# Patient Record
Sex: Female | Born: 1985 | Race: Black or African American | Hispanic: No | Marital: Single | Smoking: Never smoker
Health system: Southern US, Community
[De-identification: ages and names within clinical notes are randomized; demographics above are authoritative.]

## PROBLEM LIST (undated history)

## (undated) DIAGNOSIS — Z789 Other specified health status: Secondary | ICD-10-CM

## (undated) HISTORY — PX: NO PAST SURGERIES: SHX2092

---
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
## 2004-07-25 ENCOUNTER — Emergency Department (HOSPITAL_COMMUNITY): Admission: EM | Admit: 2004-07-25 | Discharge: 2004-07-25 | Payer: Self-pay | Admitting: Emergency Medicine

## 2005-05-16 ENCOUNTER — Encounter: Admission: RE | Admit: 2005-05-16 | Discharge: 2005-05-16 | Payer: Self-pay | Admitting: *Deleted

## 2007-12-20 ENCOUNTER — Emergency Department (HOSPITAL_COMMUNITY): Admission: EM | Admit: 2007-12-20 | Discharge: 2007-12-20 | Payer: Self-pay | Admitting: Emergency Medicine

## 2007-12-27 ENCOUNTER — Emergency Department (HOSPITAL_COMMUNITY): Admission: EM | Admit: 2007-12-27 | Discharge: 2007-12-27 | Payer: Self-pay | Admitting: Emergency Medicine

## 2009-07-21 ENCOUNTER — Emergency Department (HOSPITAL_COMMUNITY): Admission: EM | Admit: 2009-07-21 | Discharge: 2009-07-22 | Payer: Self-pay | Admitting: Emergency Medicine

## 2010-10-05 LAB — LIPASE, BLOOD: Lipase: 24 U/L (ref 11–59)

## 2010-10-05 LAB — DIFFERENTIAL
Basophils Absolute: 0.1 10*3/uL (ref 0.0–0.1)
Basophils Relative: 0 % (ref 0–1)
Monocytes Absolute: 1.1 10*3/uL — ABNORMAL HIGH (ref 0.1–1.0)
Neutro Abs: 9.4 10*3/uL — ABNORMAL HIGH (ref 1.7–7.7)
Neutrophils Relative %: 69 % (ref 43–77)

## 2010-10-05 LAB — CBC
MCHC: 35.1 g/dL (ref 30.0–36.0)
Platelets: 276 10*3/uL (ref 150–400)
RDW: 13.3 % (ref 11.5–15.5)

## 2010-10-05 LAB — URINALYSIS, ROUTINE W REFLEX MICROSCOPIC
Bilirubin Urine: NEGATIVE
Ketones, ur: NEGATIVE mg/dL
Nitrite: NEGATIVE
Urobilinogen, UA: 1 mg/dL (ref 0.0–1.0)

## 2010-10-05 LAB — HEPATIC FUNCTION PANEL
Albumin: 3.6 g/dL (ref 3.5–5.2)
Total Bilirubin: 0.7 mg/dL (ref 0.3–1.2)
Total Protein: 8.3 g/dL (ref 6.0–8.3)

## 2011-04-16 LAB — PREGNANCY, URINE: Preg Test, Ur: NEGATIVE

## 2011-04-16 LAB — POCT I-STAT, CHEM 8
BUN: 12
Calcium, Ion: 1.3
Chloride: 107
Sodium: 141

## 2011-04-16 LAB — POCT CARDIAC MARKERS
CKMB, poc: <1 — ABNORMAL LOW
Myoglobin, poc: 37.9
Operator id: 290111
Troponin i, poc: <0.05

## 2011-04-16 LAB — URINALYSIS, ROUTINE W REFLEX MICROSCOPIC
Bilirubin Urine: NEGATIVE
Hgb urine dipstick: NEGATIVE
Ketones, ur: 15 — AB
Protein, ur: NEGATIVE
Urobilinogen, UA: 1

## 2011-04-16 LAB — DIFFERENTIAL
Basophils Absolute: 0.1
Lymphocytes Relative: 25
Monocytes Absolute: 0.5
Neutro Abs: 6.9

## 2011-04-16 LAB — CBC
Hemoglobin: 12
RDW: 15.3

## 2011-04-16 LAB — POCT PREGNANCY, URINE: Preg Test, Ur: NEGATIVE

## 2013-04-12 ENCOUNTER — Encounter: Payer: Self-pay | Admitting: Gynecology

## 2014-08-06 ENCOUNTER — Ambulatory Visit (HOSPITAL_BASED_OUTPATIENT_CLINIC_OR_DEPARTMENT_OTHER): Payer: BC Managed Care – PPO

## 2014-08-06 ENCOUNTER — Other Ambulatory Visit (HOSPITAL_BASED_OUTPATIENT_CLINIC_OR_DEPARTMENT_OTHER): Payer: Self-pay | Admitting: Emergency Medicine

## 2014-08-06 ENCOUNTER — Ambulatory Visit (HOSPITAL_BASED_OUTPATIENT_CLINIC_OR_DEPARTMENT_OTHER)
Admission: RE | Admit: 2014-08-06 | Discharge: 2014-08-06 | Disposition: A | Discharge status: Home / Self Care | Payer: BC Managed Care – PPO | Source: Ambulatory Visit | Attending: Emergency Medicine | Admitting: Emergency Medicine

## 2014-08-06 DIAGNOSIS — R1310 Dysphagia, unspecified: Secondary | ICD-10-CM

## 2014-08-06 DIAGNOSIS — R221 Localized swelling, mass and lump, neck: Secondary | ICD-10-CM | POA: Diagnosis not present

## 2014-09-07 ENCOUNTER — Encounter (HOSPITAL_COMMUNITY): Payer: Self-pay

## 2014-09-07 ENCOUNTER — Emergency Department (INDEPENDENT_AMBULATORY_CARE_PROVIDER_SITE_OTHER): Payer: Self-pay

## 2014-09-07 ENCOUNTER — Emergency Department (INDEPENDENT_AMBULATORY_CARE_PROVIDER_SITE_OTHER)
Admission: EM | Admit: 2014-09-07 | Discharge: 2014-09-07 | Disposition: A | Discharge status: Home / Self Care | Payer: Self-pay | Source: Home / Self Care | Attending: Family Medicine | Admitting: Family Medicine

## 2014-09-07 DIAGNOSIS — E0789 Other specified disorders of thyroid: Secondary | ICD-10-CM

## 2014-09-07 DIAGNOSIS — R05 Cough: Secondary | ICD-10-CM

## 2014-09-07 DIAGNOSIS — D497 Neoplasm of unspecified behavior of endocrine glands and other parts of nervous system: Secondary | ICD-10-CM

## 2014-09-07 DIAGNOSIS — R0981 Nasal congestion: Secondary | ICD-10-CM

## 2014-09-07 DIAGNOSIS — R059 Cough, unspecified: Secondary | ICD-10-CM

## 2014-09-07 MED ORDER — IPRATROPIUM BROMIDE 0.06 % NA SOLN: 2.0000 | Freq: Four times a day (QID) | NASAL | Status: DC

## 2014-09-07 MED ORDER — BENZONATATE 100 MG PO CAPS: 100.0000 mg | ORAL_CAPSULE | Freq: Three times a day (TID) | ORAL | Status: DC | PRN

## 2014-09-07 NOTE — ED Notes (Signed)
Concerned about URI / influenza residual since early January. Has started to have URI type syx after recovering from inital illness. Was told by another provider she had a nodule in her thyroid gland that needed to be biopsied (has not been done) and she is now concerned about autoimmune thyroid illness after reading about her symptoms

## 2014-09-07 NOTE — ED Provider Notes (Signed)
CSN: [id number]     Arrival date & time 09/07/14  1244 History   First MD Initiated Contact with Patient 09/07/14 1331     Chief Complaint  Patient presents with  . Cough  . Thyroid Problem   (Consider location/radiation/quality/duration/timing/severity/associated sxs/prior Treatment) HPI Comments: In addition to URI symptoms, patient reports that she was diagnosed with a thyroid nodule in Jan. 2016 and requests that we perform biopsy of thyroid nodule in clinic today. States she developed thyroid enlargement in Dec 2015 and was seen at local urgent care and sent for thyroid U/S. States she no longer has health insurance and has not attempted to see her PCP for follow up or made arrangements for recommended thyroid biopsy. States that area of left sided thyroid enlargement has both reduced in size and discomfort and that she no longer has pain with swallowing. States she attempted to apply for Medicaid today but was informed that she did not qualify.   Patient is a 29 y.o. female presenting with URI.  URI Presenting symptoms: congestion, cough, rhinorrhea and sore throat   Presenting symptoms: no fatigue and no fever   Severity:  Mild Onset quality:  Gradual Duration:  1 week Timing:  Constant Progression:  Unchanged Chronicity:  Recurrent Ineffective treatments: NetiPot. Associated symptoms: neck pain, sneezing and swollen glands   Associated symptoms: no headaches, no sinus pain and no wheezing     History reviewed. No pertinent past medical history. History reviewed. No pertinent past surgical history. History reviewed. No pertinent family history. History  Substance Use Topics  . Smoking status: Never Smoker   . Smokeless tobacco: Not on file  . Alcohol Use: Yes   OB History    No data available     Review of Systems  Constitutional: Negative for fever, chills, activity change, appetite change, fatigue and unexpected weight change.  HENT: Positive for congestion,  postnasal drip, rhinorrhea, sneezing and sore throat.   Eyes: Negative.   Respiratory: Positive for cough. Negative for chest tightness, shortness of breath and wheezing.   Cardiovascular: Negative.   Gastrointestinal: Negative.   Endocrine: Negative for polydipsia, polyphagia and polyuria.  Musculoskeletal: Positive for neck pain.  Skin: Negative.   Neurological: Negative for dizziness, weakness, light-headedness and headaches.    Allergies  Review of patient's allergies indicates no known allergies.  Home Medications   Prior to Admission medications   Medication Sig Start Date End Date Taking? Authorizing Provider  benzonatate (TESSALON) 100 MG capsule Take 1 capsule (100 mg total) by mouth 3 (three) times daily as needed for cough. 09/07/14   Audelia Hives Fiorella Hanahan, PA  ipratropium (ATROVENT) 0.06 % nasal spray Place 2 sprays into both nostrils 4 (four) times daily. For nasal congestion 09/07/14   Annett Gula H Letticia Bhattacharyya, PA   BP 114/67 mmHg  Pulse 98  Temp(Src) 97 F (36.1 C) (Oral)  Resp 18  SpO2 97%  LMP 08/20/2014 (Exact Date) Physical Exam  Constitutional: She is oriented to person, place, and time. She appears well-developed and well-nourished. No distress.  HENT:  Head: Normocephalic and atraumatic.  Right Ear: Hearing, tympanic membrane, external ear and ear canal normal.  Left Ear: Hearing, tympanic membrane, external ear and ear canal normal.  Nose: Mucosal edema and rhinorrhea present.  Mouth/Throat: Uvula is midline, oropharynx is clear and moist and mucous membranes are normal.  Eyes: Conjunctivae are normal. No scleral icterus.  Neck: Normal range of motion, full passive range of motion without pain and phonation normal.  Neck supple. Thyromegaly present.    Outlined area is region of palpable neck mass at left lateral region of thyroid gland  Cardiovascular: Normal rate, regular rhythm and normal heart sounds.   Pulmonary/Chest: Effort normal and breath sounds  normal. No stridor. No respiratory distress. She has no wheezes.  Musculoskeletal: Normal range of motion.  Lymphadenopathy:    She has no cervical adenopathy.  Neurological: She is alert and oriented to person, place, and time.  Skin: Skin is warm and dry.  Psychiatric: She has a normal mood and affect.  Nursing note and vitals reviewed.   ED Course  Procedures (including critical care time) Labs Review Labs Reviewed - No data to display  Imaging Review Dg Chest 2 View  09/07/2014   CLINICAL DATA:  All waking at night due to coughing, productive cough, symptoms for 1 month getting worse over time, nonsmoker, history thyroid problems  EXAM: CHEST  2 VIEW  COMPARISON:  None  FINDINGS: Upper normal heart size.  Pulmonary vascularity normal.  Mass effect upon LEFT lateral aspect of trachea with LEFT-to-RIGHT displacement, question thyroid enlargement or mass.  Lungs clear.  No pleural effusion or pneumothorax.  Bones unremarkable.  IMPRESSION: Question enlargement versus mass within LEFT thyroid lobe exerting mass effect upon LEFT lateral aspect of trachea; followup thyroid sonography recommended to assess.   Electronically Signed   By: Lavonia Dana M.D.   On: 09/07/2014 14:37     MDM   1. Thyroid mass of unclear etiology   2. Cough   3. Nasal congestion    As we discussed, the mass at your left thyroid has now started to push against your trachea (windpipe) and this should be addresses as soon as possible. You can be seen by either [facility], [facility] or [facility]. Because you are not covered by health insurance, you may either contact our patient access coordinator [facility]) or present at your nearest ER for evaluation. You may use the medications prescribed for your cough and congestion. Because this has begun to involve your airway and you have limited access to follow up care, it would be my recommendation that you have yourself seen at your nearest ER as soon as  possible.    Lutricia Feil, [place] 09/07/14 [phone]

## 2014-09-07 NOTE — Discharge Instructions (Signed)
As we discussed, the mass at your left thyroid has now started to push against your trachea (windpipe) and this should be addresses as soon as possible. You can be seen by either [facility], N[facility] Thro[facility] or [facility]. Because you are not covered by health insurance, you may either contact our patient access coordinator [facility]) or present at your nearest ER for evaluation. You may use the medications prescribed for your cough and congestion. Because this has begun to involve your airway and you have limited access to follow up care, it would be my recommendation that you have yourself seen at your nearest ER as soon as possible.   Cough, Adult  A cough is a reflex that helps clear your throat and airways. It can help heal the body or may be a reaction to an irritated airway. A cough may only last 2 or 3 weeks (acute) or may last more than 8 weeks (chronic).  CAUSES Acute cough:  Viral or bacterial infections. Chronic cough:  Infections.  Allergies.  Asthma.  Post-nasal drip.  Smoking.  Heartburn or acid reflux.  Some medicines.  Chronic lung problems (COPD).  Cancer. SYMPTOMS   Cough.  Fever.  Chest pain.  Increased breathing rate.  High-pitched whistling sound when breathing (wheezing).  Colored mucus that you cough up (sputum). TREATMENT   A bacterial cough may be treated with antibiotic medicine.  A viral cough must run its course and will not respond to antibiotics.  Your caregiver may recommend other treatments if you have a chronic cough. HOME CARE INSTRUCTIONS   Only take over-the-counter or prescription medicines for pain, discomfort, or fever as directed by your caregiver. Use cough suppressants only as directed by your caregiver.  Use a cold steam vaporizer or humidifier in your bedroom or home to help loosen secretions.  Sleep in a semi-upright position if your cough is worse at night.  Rest as needed.  Stop smoking if you  smoke. SEEK IMMEDIATE MEDICAL CARE IF:   You have pus in your sputum.  Your cough starts to worsen.  You cannot control your cough with suppressants and are losing sleep.  You begin coughing up blood.  You have difficulty breathing.  You develop pain which is getting worse or is uncontrolled with medicine.  You have a fever. MAKE SURE YOU:   Understand these instructions.  Will watch your condition.  Will get help right away if you are not doing well or get worse. Document Released: 01/02/2011 Document Revised: 09/28/2011 Document Reviewed: 01/02/2011 [facility] Patient Information 2015 [place], [place]. This information is not intended to replace advice given to you by your health care provider. Make sure you discuss any questions you have with your health care provider.

## 2015-02-06 ENCOUNTER — Emergency Department (INDEPENDENT_AMBULATORY_CARE_PROVIDER_SITE_OTHER)
Admission: EM | Admit: 2015-02-06 | Discharge: 2015-02-06 | Disposition: A | Discharge status: Home / Self Care | Payer: Self-pay | Source: Home / Self Care | Attending: Emergency Medicine | Admitting: Emergency Medicine

## 2015-02-06 ENCOUNTER — Encounter (HOSPITAL_COMMUNITY): Payer: Self-pay | Admitting: Emergency Medicine

## 2015-02-06 DIAGNOSIS — R1012 Left upper quadrant pain: Secondary | ICD-10-CM

## 2015-02-06 DIAGNOSIS — R1011 Right upper quadrant pain: Secondary | ICD-10-CM

## 2015-02-06 LAB — POCT I-STAT, CHEM 8
BUN: 6 mg/dL (ref 6–20)
CREATININE: 0.7 mg/dL (ref 0.44–1.00)
Calcium, Ion: 1.24 mmol/L — ABNORMAL HIGH (ref 1.12–1.23)
Chloride: 103 mmol/L (ref 101–111)
Glucose, Bld: 83 mg/dL (ref 65–99)
HEMATOCRIT: 38 % (ref 36.0–46.0)
HEMOGLOBIN: 12.9 g/dL (ref 12.0–15.0)
POTASSIUM: 3.9 mmol/L (ref 3.5–5.1)
Sodium: 139 mmol/L (ref 135–145)
TCO2: 23 mmol/L (ref 0–100)

## 2015-02-06 LAB — POCT URINALYSIS DIP (DEVICE)
Bilirubin Urine: NEGATIVE
GLUCOSE, UA: NEGATIVE mg/dL
Hgb urine dipstick: NEGATIVE
KETONES UR: NEGATIVE mg/dL
Leukocytes, UA: NEGATIVE
NITRITE: NEGATIVE
Protein, ur: NEGATIVE mg/dL
Specific Gravity, Urine: 1.015 (ref 1.005–1.030)
Urobilinogen, UA: 0.2 mg/dL (ref 0.0–1.0)
pH: 7.5 (ref 5.0–8.0)

## 2015-02-06 LAB — POCT PREGNANCY, URINE: PREG TEST UR: NEGATIVE

## 2015-02-06 MED ORDER — DICLOFENAC SODIUM 75 MG PO TBEC
75.0000 mg | DELAYED_RELEASE_TABLET | Freq: Two times a day (BID) | ORAL | Status: DC
Start: 2015-02-06 — End: 2015-09-12

## 2015-02-06 NOTE — ED Provider Notes (Signed)
CSN: [id number]     Arrival date & time 02/06/15  1350 History   First MD Initiated Contact with Patient 02/06/15 1424     Chief Complaint  Patient presents with  . Abdominal Pain   (Consider location/radiation/quality/duration/timing/severity/associated sxs/prior Treatment) HPI  She is a 29 year old woman here for evaluation of abdominal pain. She states it started this morning in the left side, but is now across her upper abdomen. She states she did several home remedies this morning, including eating a banana, apple cider vinegar, and tumeric.  The pain improved some.  She was able to get ready and go to a funeral, but during the funeral but pain got worse so she came here. The pain is worse with movement, particularly laying down and sitting up. She had some nausea this morning, but none sense. No vomiting. She has had 2 soft bowel movements this morning which is a little unusual for her. She does state there has been a lot of stress in certain areas of her life recently. No new activities or exercise regimens. No fevers or chills. She denies any questionable foods.  History reviewed. No pertinent past medical history. History reviewed. No pertinent past surgical history. History reviewed. No pertinent family history. History  Substance Use Topics  . Smoking status: Never Smoker   . Smokeless tobacco: Not on file  . Alcohol Use: Yes   OB History    No data available     Review of Systems As in history of present illness Allergies  Review of patient's allergies indicates no known allergies.  Home Medications   Prior to Admission medications   Medication Sig Start Date End Date Taking? Authorizing Provider  benzonatate (TESSALON) 100 MG capsule Take 1 capsule (100 mg total) by mouth 3 (three) times daily as needed for cough. 09/07/14   Audelia Hives Presson, PA  diclofenac (VOLTAREN) 75 MG EC tablet Take 1 tablet (75 mg total) by mouth 2 (two) times daily. For 3 days, then as  needed. 02/06/15   Melony Overly, MD  ipratropium (ATROVENT) 0.06 % nasal spray Place 2 sprays into both nostrils 4 (four) times daily. For nasal congestion 09/07/14   Annett Gula H Presson, PA   BP 141/99 mmHg  Pulse 67  Temp(Src) 98.2 F (36.8 C) (Oral)  Resp 16  SpO2 97% Physical Exam  Constitutional: She appears well-developed and well-nourished. No distress.  Neck: Neck supple. Thyromegaly present.  Cardiovascular: Normal rate.   Pulmonary/Chest: Effort normal.  Abdominal: Soft. Bowel sounds are normal. She exhibits no distension and no mass. There is tenderness (diffusely tender across upper abdomen. This is present with and without abdominal muscles engaged.). There is no rebound and no guarding.  Neurological: She is alert.    ED Course  Procedures (including critical care time) Labs Review Labs Reviewed  POCT I-STAT, CHEM 8 - Abnormal; Notable for the following:    Calcium, Ion 1.24 (*)    All other components within normal limits  POCT URINALYSIS DIP (DEVICE)  POCT PREGNANCY, URINE    Imaging Review No results found.   MDM   1. Abdominal wall pain in both upper quadrants    Pain appears to be in the abdominal wall musculature. I suspect with the increased stress she is clenching her abdominal muscles frequently. Discussed mindfulness. Recommended diclofenac regularly for the next 3 days, then as needed. Return precautions reviewed.    Melony Overly, MD 02/06/15 [phone]

## 2015-02-06 NOTE — ED Notes (Signed)
Co abd pain on left side this morning States she is urinary frequently Denies any discharge Banana and home remedies used as tx

## 2015-02-06 NOTE — Discharge Instructions (Signed)
The pain is coming from your abdominal wall muscles. Please practice mindfulness at least 3 times a day. Take diclofenac twice a day for the next 3 days, then as needed. You should see improvement over the next 2-3 days. If you develop vomiting, fevers, diarrhea, the pain changes, please come back or go to the emergency room. [facility] works with patients without insurance.

## 2015-09-12 ENCOUNTER — Inpatient Hospital Stay (HOSPITAL_COMMUNITY)
Admission: AD | Admit: 2015-09-12 | Discharge: 2015-09-12 | Disposition: A | Discharge status: Home / Self Care | Payer: Self-pay | Source: Ambulatory Visit | Attending: Obstetrics & Gynecology | Admitting: Obstetrics & Gynecology

## 2015-09-12 ENCOUNTER — Encounter (HOSPITAL_COMMUNITY): Payer: Self-pay | Admitting: *Deleted

## 2015-09-12 ENCOUNTER — Inpatient Hospital Stay (HOSPITAL_COMMUNITY): Payer: Self-pay

## 2015-09-12 DIAGNOSIS — B9689 Other specified bacterial agents as the cause of diseases classified elsewhere: Secondary | ICD-10-CM

## 2015-09-12 DIAGNOSIS — A499 Bacterial infection, unspecified: Secondary | ICD-10-CM

## 2015-09-12 DIAGNOSIS — R109 Unspecified abdominal pain: Secondary | ICD-10-CM

## 2015-09-12 DIAGNOSIS — N898 Other specified noninflammatory disorders of vagina: Secondary | ICD-10-CM

## 2015-09-12 DIAGNOSIS — N760 Acute vaginitis: Secondary | ICD-10-CM | POA: Insufficient documentation

## 2015-09-12 HISTORY — DX: Other specified health status: Z78.9

## 2015-09-12 LAB — URINALYSIS, ROUTINE W REFLEX MICROSCOPIC
Bilirubin Urine: NEGATIVE
Glucose, UA: NEGATIVE mg/dL
Hgb urine dipstick: NEGATIVE
Ketones, ur: NEGATIVE mg/dL
LEUKOCYTES UA: NEGATIVE
NITRITE: NEGATIVE
Protein, ur: NEGATIVE mg/dL
SPECIFIC GRAVITY, URINE: 1.025 (ref 1.005–1.030)
pH: 6 (ref 5.0–8.0)

## 2015-09-12 LAB — WET PREP, GENITAL
SPERM: NONE SEEN
TRICH WET PREP: NONE SEEN
WBC WET PREP: NONE SEEN
Yeast Wet Prep HPF POC: NONE SEEN

## 2015-09-12 LAB — POCT PREGNANCY, URINE: Preg Test, Ur: NEGATIVE

## 2015-09-12 MED ORDER — IBUPROFEN 600 MG PO TABS: 600.0000 mg | ORAL_TABLET | Freq: Four times a day (QID) | ORAL | Status: AC | PRN

## 2015-09-12 MED ORDER — IBUPROFEN 600 MG PO TABS
600.0000 mg | ORAL_TABLET | Freq: Once | ORAL | Status: AC
  Administered 2015-09-12: 600 mg via ORAL
  Filled 2015-09-12: qty 1

## 2015-09-12 MED ORDER — ACYCLOVIR 200 MG PO CAPS: ORAL_CAPSULE | ORAL | Status: AC

## 2015-09-12 MED ORDER — METRONIDAZOLE 500 MG PO TABS: 500.0000 mg | ORAL_TABLET | Freq: Two times a day (BID) | ORAL | Status: AC

## 2015-09-12 MED ORDER — ACYCLOVIR 200 MG PO CAPS: ORAL_CAPSULE | ORAL | Status: DC

## 2015-09-12 MED ORDER — ACETAMINOPHEN-CODEINE 300-30 MG PO TABS: 1.0000 | ORAL_TABLET | ORAL | Status: AC | PRN

## 2015-09-12 NOTE — Discharge Instructions (Signed)

## 2015-09-12 NOTE — MAU Provider Note (Signed)
History   [id number]   Chief Complaint  Patient presents with  . Rash  . Contraception    HPI Stephanie Frederick is a 30 y.o. female  [id number] here with report of painful rash for past 5-7 days.  Also reports pain with urination and c/o lower L groin pain for past 5 days.  Denies using any new lotions, soaps, or detergents. +white vaginal discharge, no odor.   Concerned regarding IUD which was placed in 2014.  No problems with it in the past.    No LMP recorded. Patient is not currently having periods (Reason: IUD).  OB History  Gravida Para Term Preterm AB SAB TAB Ectopic Multiple Living  0 0 0 0 0 0 0 0 0 0         Past Medical History  Diagnosis Date  . Medical history non-contributory     Family History  Problem Relation Age of Onset  . Alcohol abuse Neg Hx   . Arthritis Neg Hx   . Asthma Neg Hx   . Birth defects Neg Hx   . Cancer Neg Hx   . COPD Neg Hx   . Depression Neg Hx   . Diabetes Neg Hx   . Drug abuse Neg Hx   . Early death Neg Hx   . Hearing loss Neg Hx   . Heart disease Neg Hx   . Hyperlipidemia Neg Hx   . Hypertension Neg Hx   . Kidney disease Neg Hx   . Learning disabilities Neg Hx   . Mental illness Neg Hx   . Mental retardation Neg Hx   . Miscarriages / Stillbirths Neg Hx   . Stroke Neg Hx   . Vision loss Neg Hx   . Varicose Veins Neg Hx     Social History   Social History  . Marital Status: Single    Spouse Name: N/A  . Number of Children: N/A  . Years of Education: N/A   Social History Main Topics  . Smoking status: Never Smoker   . Smokeless tobacco: None  . Alcohol Use: Yes  . Drug Use: No  . Sexual Activity: Not Asked   Other Topics Concern  . None   Social History Narrative    No Known Allergies  No current facility-administered medications on file prior to encounter.   Current Outpatient Prescriptions on File Prior to Encounter  Medication Sig Dispense Refill  . benzonatate (TESSALON) 100 MG capsule Take 1 capsule  (100 mg total) by mouth 3 (three) times daily as needed for cough. 21 capsule 0  . diclofenac (VOLTAREN) 75 MG EC tablet Take 1 tablet (75 mg total) by mouth 2 (two) times daily. For 3 days, then as needed. 30 tablet 0  . ipratropium (ATROVENT) 0.06 % nasal spray Place 2 sprays into both nostrils 4 (four) times daily. For nasal congestion 15 mL 0     Review of Systems  Constitutional: Negative for fever and chills.  Genitourinary: Positive for dysuria, vaginal discharge, genital sores, vaginal pain and pelvic pain (LLQ pain). Negative for frequency, hematuria, flank pain, vaginal bleeding and menstrual problem.  All other systems reviewed and are negative.    Physical Exam   Filed Vitals:   09/12/15 1225  BP: 106/60  Pulse: 108  Temp: 98.6 F (37 C)  TempSrc: Oral  Resp: 18    Physical Exam  Constitutional: She is oriented to person, place, and time. She appears well-developed and well-nourished.  HENT:  Head: Normocephalic.  Neck: Normal range of motion. Neck supple.  Cardiovascular: Normal rate, regular rhythm and normal heart sounds.   Respiratory: Effort normal and breath sounds normal. No respiratory distress.  GI: Soft. There is no tenderness.  Genitourinary:    Cervix exhibits no motion tenderness. No bleeding in the vagina. Vaginal discharge (mucusy) found.  IUD strings  Musculoskeletal: Normal range of motion. She exhibits no edema.  Neurological: She is alert and oriented to person, place, and time.  Skin: Skin is warm and dry.    MAU Course  Procedures  MDM Results for orders placed or performed during the hospital encounter of 09/12/15 (from the past 24 hour(s))  Urinalysis, Routine w reflex microscopic (not at [facility])     Status: None   Collection Time: 09/12/15 12:10 PM  Result Value Ref Range   Color, Urine YELLOW YELLOW   APPearance CLEAR CLEAR   Specific Gravity, Urine 1.025 1.005 - 1.030   pH 6.0 5.0 - 8.0   Glucose, UA NEGATIVE NEGATIVE mg/dL    Hgb urine dipstick NEGATIVE NEGATIVE   Bilirubin Urine NEGATIVE NEGATIVE   Ketones, ur NEGATIVE NEGATIVE mg/dL   Protein, ur NEGATIVE NEGATIVE mg/dL   Nitrite NEGATIVE NEGATIVE   Leukocytes, UA NEGATIVE NEGATIVE  Pregnancy, urine POC     Status: None   Collection Time: 09/12/15 12:20 PM  Result Value Ref Range   Preg Test, Ur NEGATIVE NEGATIVE  Wet prep, genital     Status: Abnormal   Collection Time: 09/12/15  1:15 PM  Result Value Ref Range   Yeast Wet Prep HPF POC NONE SEEN NONE SEEN   Trich, Wet Prep NONE SEEN NONE SEEN   Clue Cells Wet Prep HPF POC PRESENT (A) NONE SEEN   WBC, Wet Prep HPF POC NONE SEEN NONE SEEN   Sperm NONE SEEN    Ultrasound: FINDINGS: Uterus  Measurements: 7.8 x 4.4 x 4.6 cm. Retroverted. No fibroids or other mass visualized.  Endometrium  Thickness: 6 mm. IUD is visualized within the endometrial cavity.  Right ovary  Measurements: 4.2 x 3.1 x 4.5 cm. Several follicles are noted, with largest measuring 3.0 cm. No masses identified.  Left ovary  Measurements: 2.1 x 2.4 x 3.3 cm. Normal appearance/no adnexal mass.  Other findings  Tiny amount of free fluid in pelvic cul-de-sac is likely physiologic.  IMPRESSION: Retroverted uterus, with IUD visualized within endometrial cavity. No fibroids identified.  Normal appearance of both ovaries. No adnexal mass identified.   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

## 2015-09-12 NOTE — MAU Note (Signed)
C/o progressively painful rash for past 5-7 days; having pain with urination; c/o lower L groin pain for past 5 days; c/o vagina pain for past 2 weeks;

## 2015-09-13 LAB — GC/CHLAMYDIA PROBE AMP (~~LOC~~) NOT AT ARMC
Chlamydia: POSITIVE — AB
Neisseria Gonorrhea: NEGATIVE

## 2015-09-13 LAB — RPR: RPR Ser Ql: NONREACTIVE

## 2015-09-13 LAB — HIV ANTIBODY (ROUTINE TESTING W REFLEX): HIV SCREEN 4TH GENERATION: NONREACTIVE

## 2015-09-15 ENCOUNTER — Telehealth: Payer: Self-pay | Admitting: Advanced Practice Midwife

## 2015-09-15 MED ORDER — AZITHROMYCIN 500 MG PO TABS: 1000.0000 mg | ORAL_TABLET | Freq: Once | ORAL | Status: AC

## 2015-09-15 NOTE — Telephone Encounter (Signed)
Pt called and notified regarding +chlamydia.  RX sent to pharmacy.  Explained partner treatment needed; no longer in relationship.

## 2015-09-16 LAB — HERPES SIMPLEX VIRUS CULTURE: Culture: NOT DETECTED

## 2015-10-11 ENCOUNTER — Encounter: Payer: Self-pay | Admitting: Obstetrics & Gynecology

## 2016-04-06 IMAGING — US US SOFT TISSUE HEAD/NECK
1 series · 14 of 25 positions shown · non-contrast
Comparison: None.

CLINICAL DATA: 28-year-old female with a history of left neck
swelling.

EXAM:
THYROID ULTRASOUND
TECHNIQUE: Ultrasound examination of the thyroid gland and adjacent soft
tissues was performed.

[Series 1: us soft tissue head/neck · 0.08mm/px · 14 of 26 slices shown]
[im 1/26]
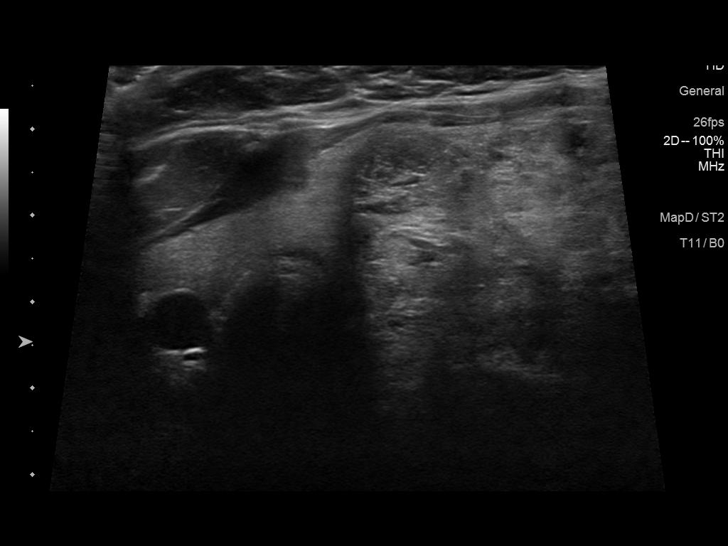
[im 3/26]
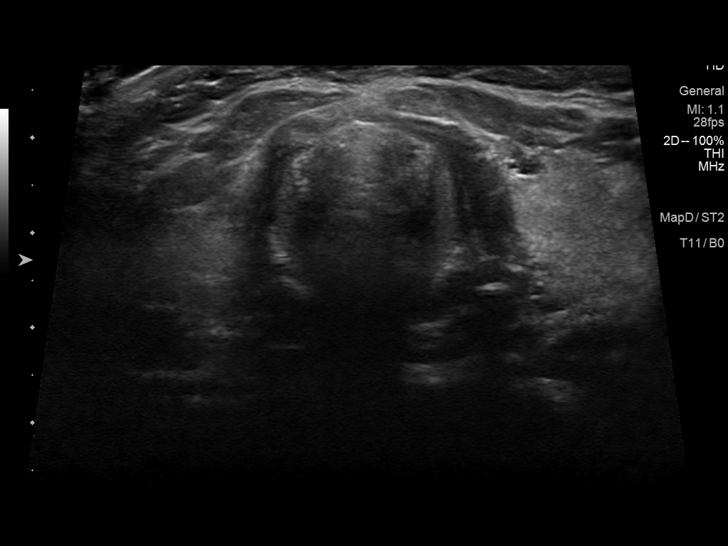
[im 5/26]
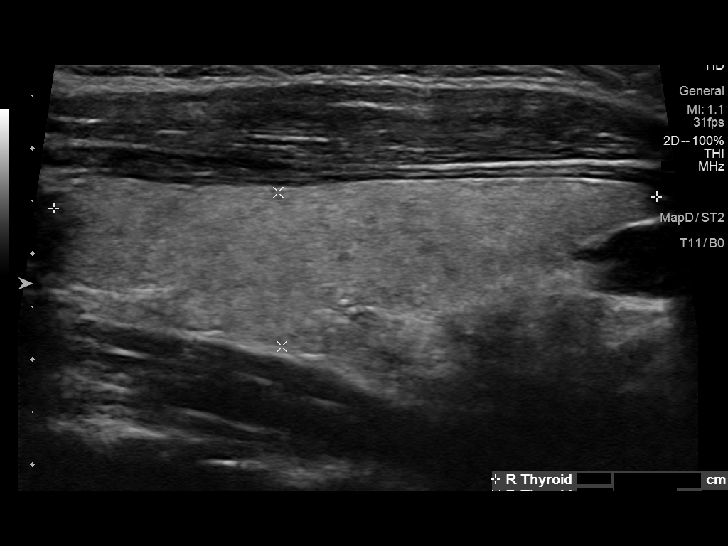
[im 7/26]
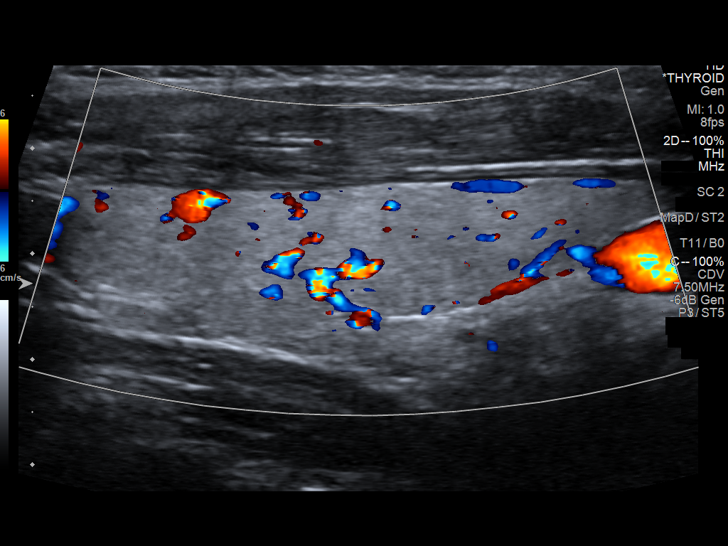
[im 9/26]
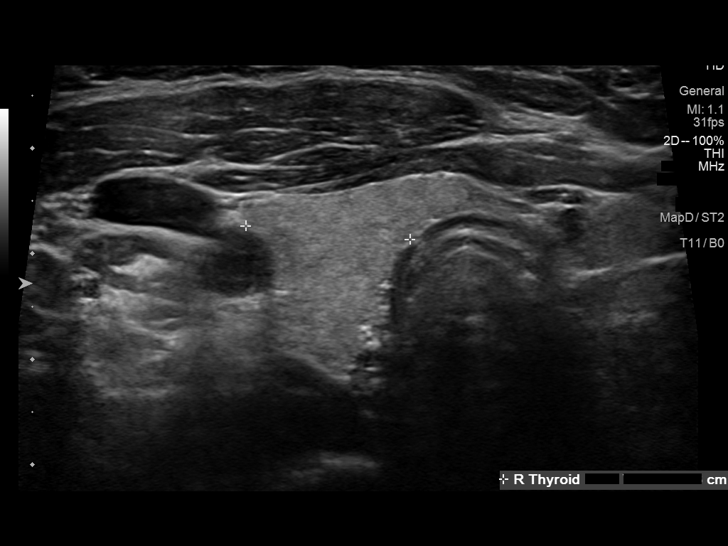
[im 10/26]
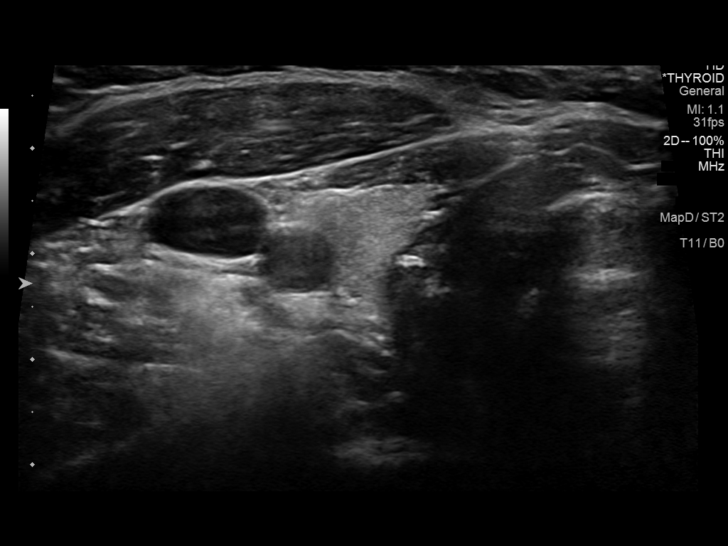
[im 12/26]
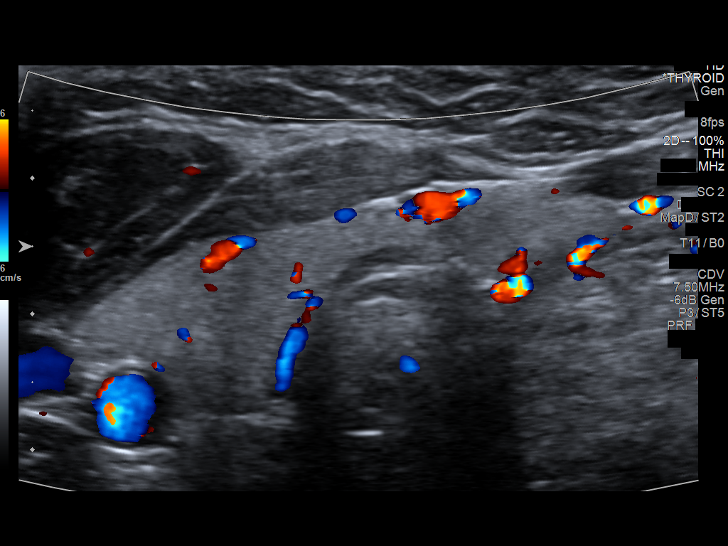
[im 14/26]
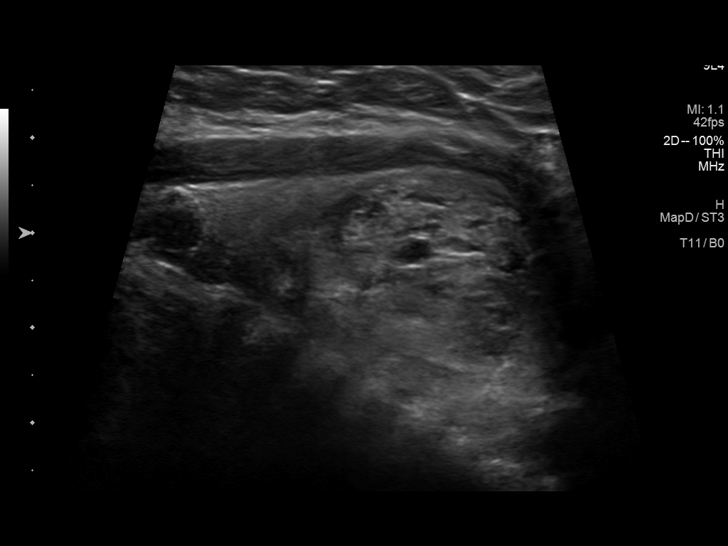
[im 16/26]
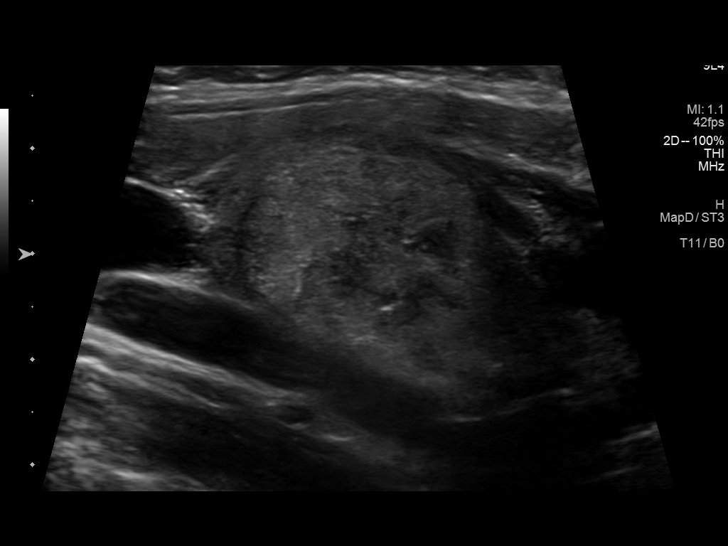
[im 17/26]
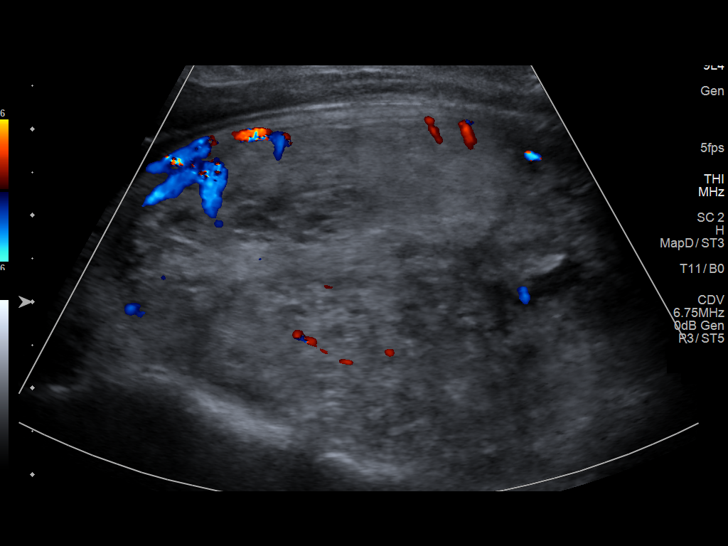
[im 19/26]
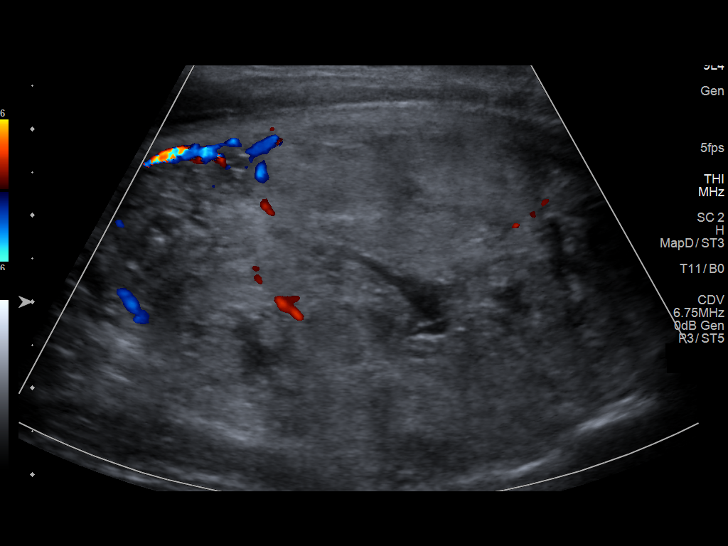
[im 21/26]
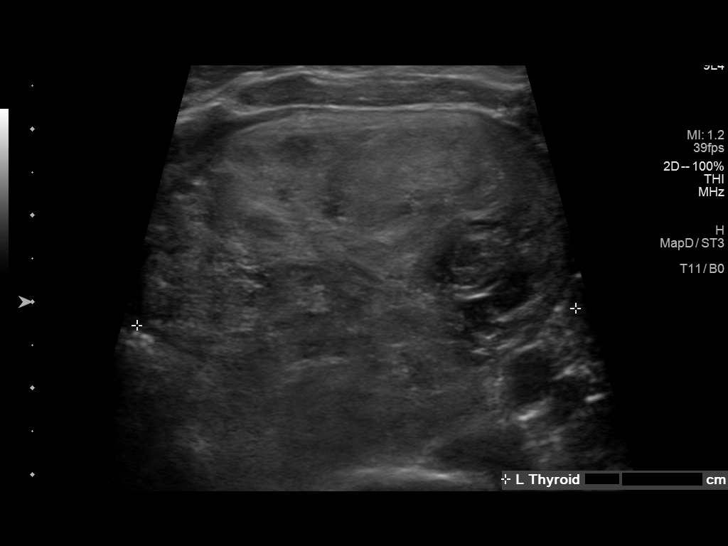
[im 23/26]
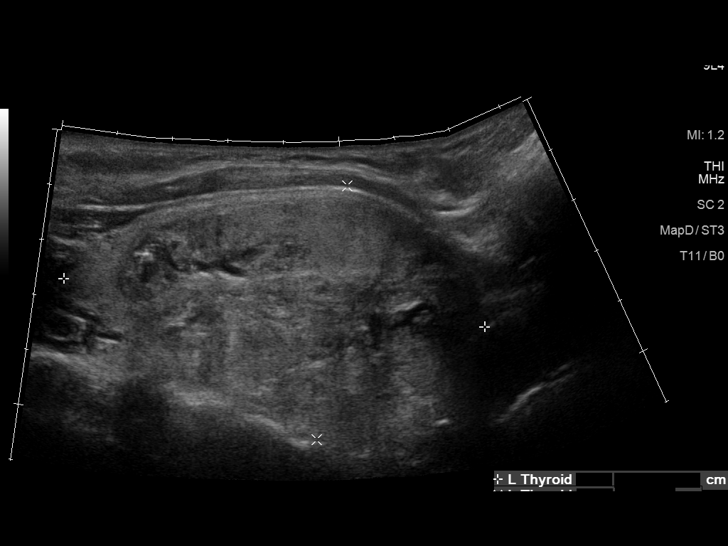
[im 26/26]
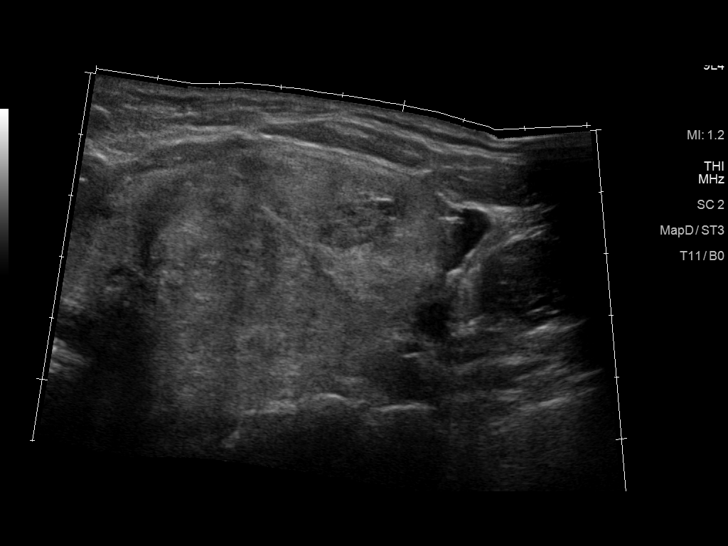

[14 of 25 positions shown; findings below may reference images not displayed]

FINDINGS: Right thyroid lobe

Measurements: 5.7 cm x 1.5 cm x 1.6 cm.  No nodules visualized.

Left thyroid lobe

Measurements: 7.6 cm x 5.0 cm x 5.1 cm. Dominant nodule involves the
left thyroid lobe, measuring 6.3 cm x 4.9 cm x 4.1 cm. Complex,
solid internal features with internal flow.

Isthmus

Thickness: 4 mm.  No nodules visualized.

Lymphadenopathy

None visualized.
IMPRESSION: Dominant left-sided nodule meets criteria for biopsy.

Findings meet consensus criteria for biopsy. Ultrasound-guided fine
needle aspiration should be considered, as per the consensus
statement: Management of Thyroid Nodules Detected at US: Society of
Radiologists in Ultrasound Consensus Conference Statement. Radiology
6225; [DATE].

## 2017-05-13 IMAGING — US US PELVIS COMPLETE
1 series · 15 of 25 positions shown · non-contrast
Comparison: None

CLINICAL DATA: Abdominal and pelvic pain. Lost IUD strings. Vaginal
pain and dysuria for 2 weeks.

EXAM:
TRANSABDOMINAL AND TRANSVAGINAL ULTRASOUND OF PELVIS
TECHNIQUE: Both transabdominal and transvaginal ultrasound examinations of the
pelvis were performed. Transabdominal technique was performed for
global imaging of the pelvis including uterus, ovaries, adnexal
regions, and pelvic cul-de-sac. It was necessary to proceed with
endovaginal exam following the transabdominal exam to visualize the
IUD location and ovaries.

[Series 1: us pelvis complete · 15 of 73 slices shown]
[im 1/73]
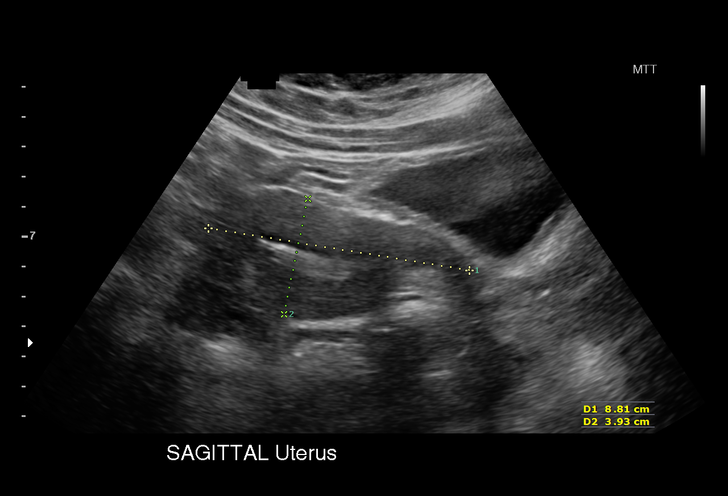
[im 7/73]
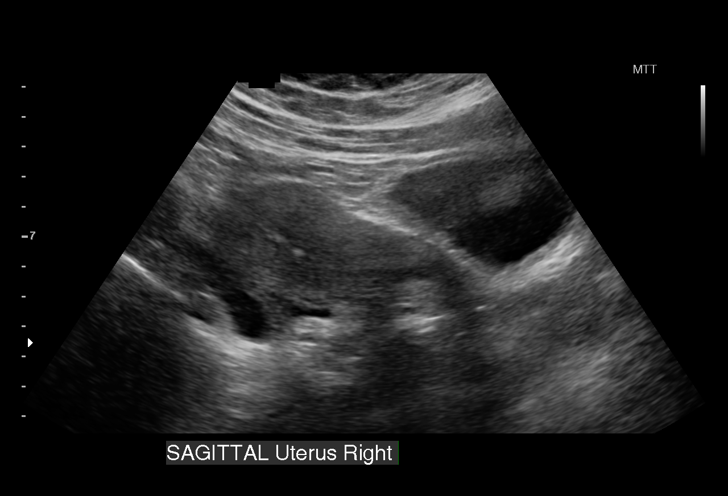
[im 13/73]
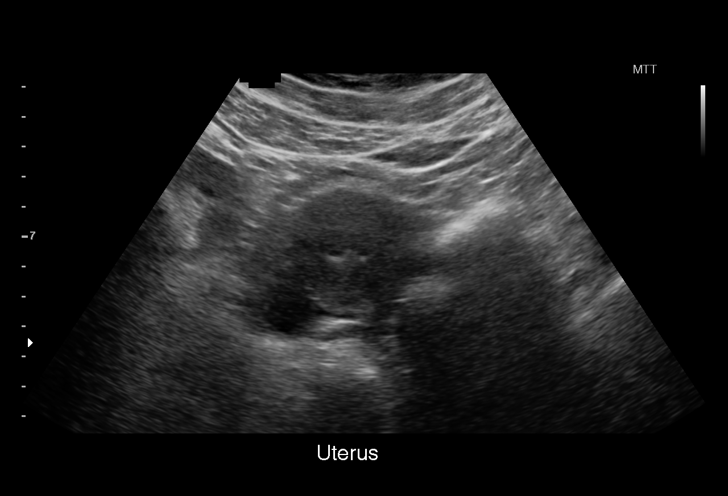
[im 16/73]
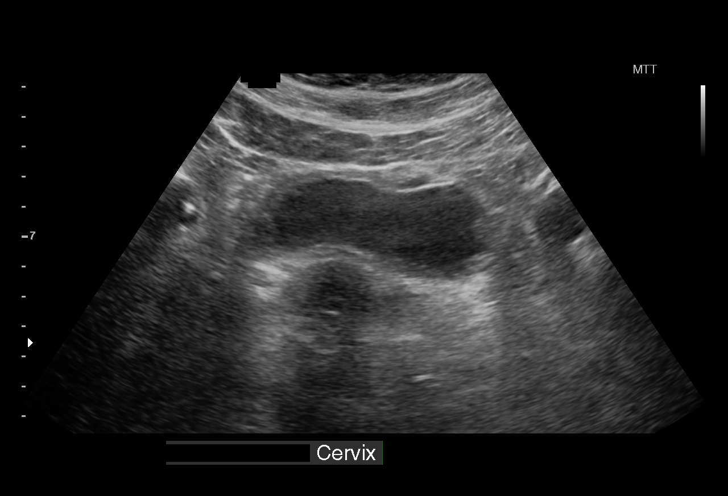
[im 22/73]
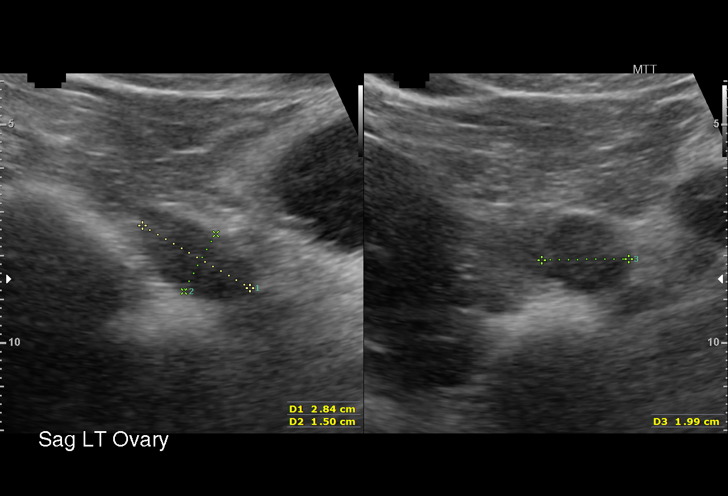
[im 28/73]
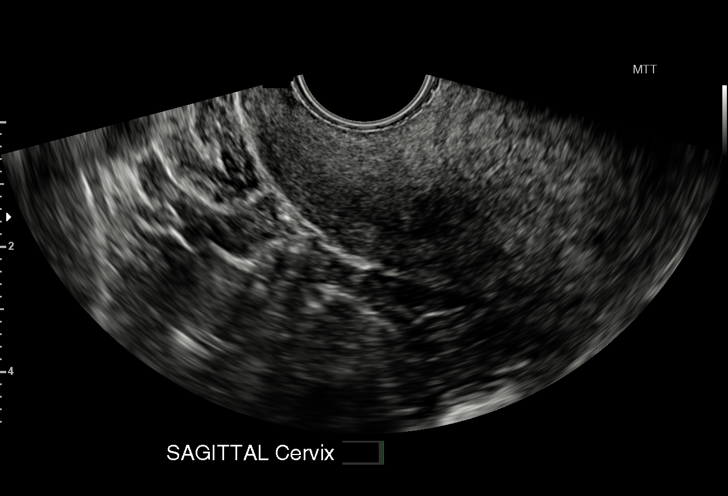
[im 31/73]
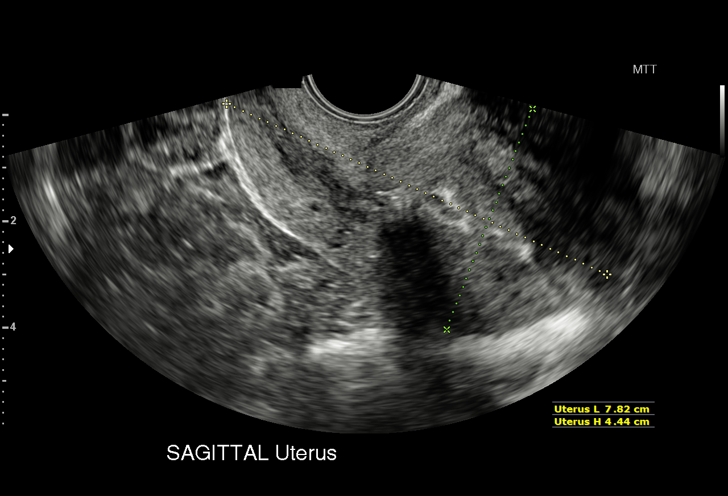
[im 37/73]
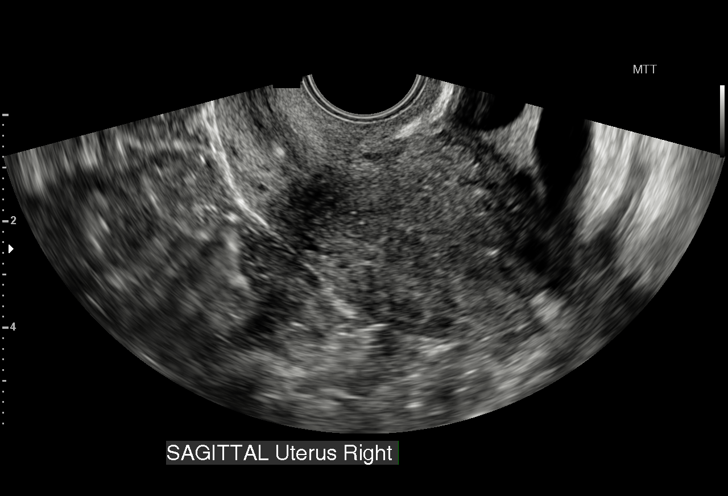
[im 43/73]
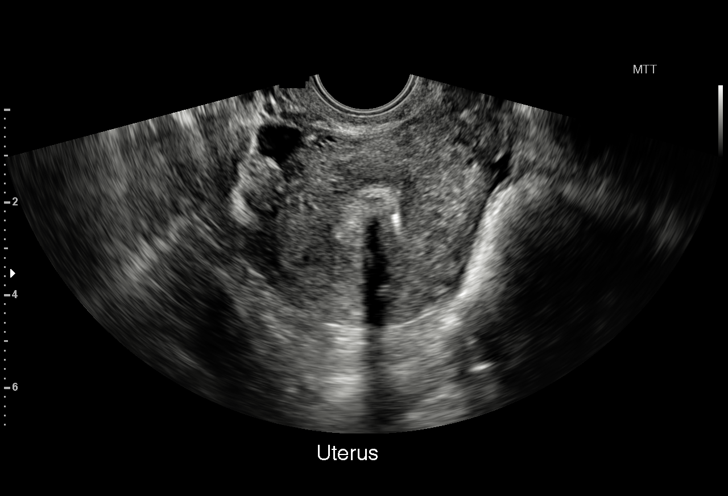
[im 46/73]
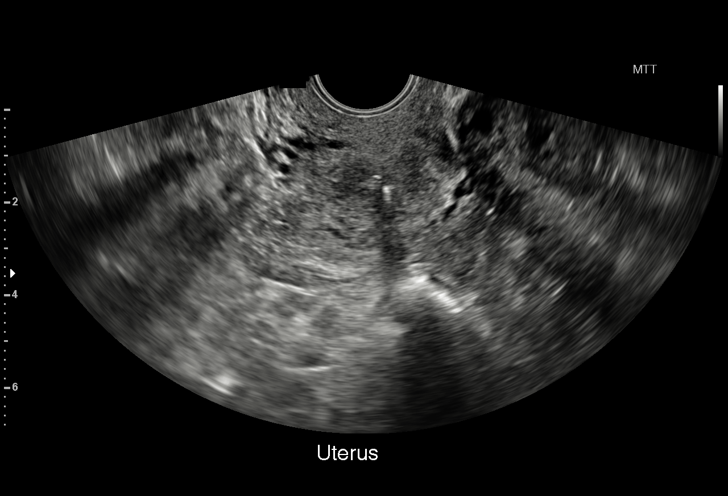
[im 52/73]
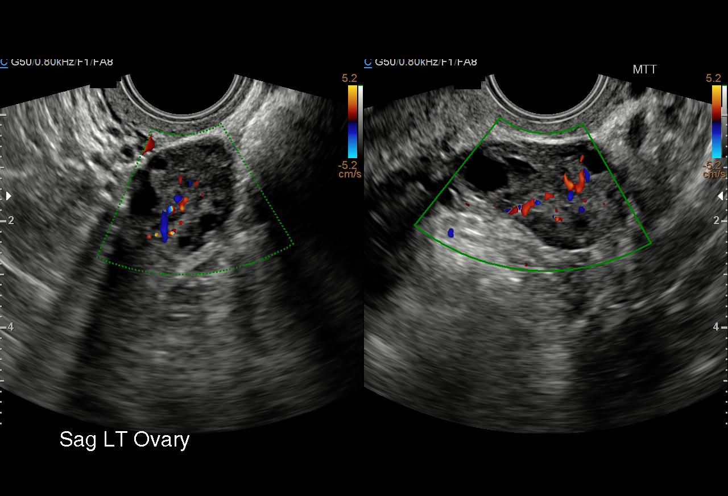
[im 58/73]
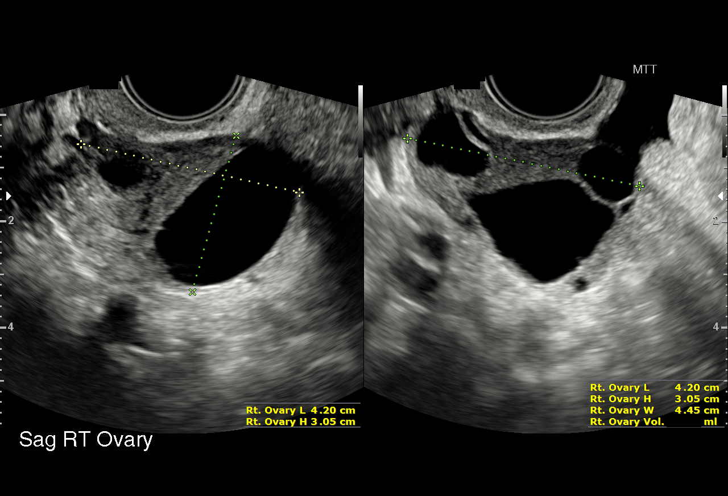
[im 61/73]
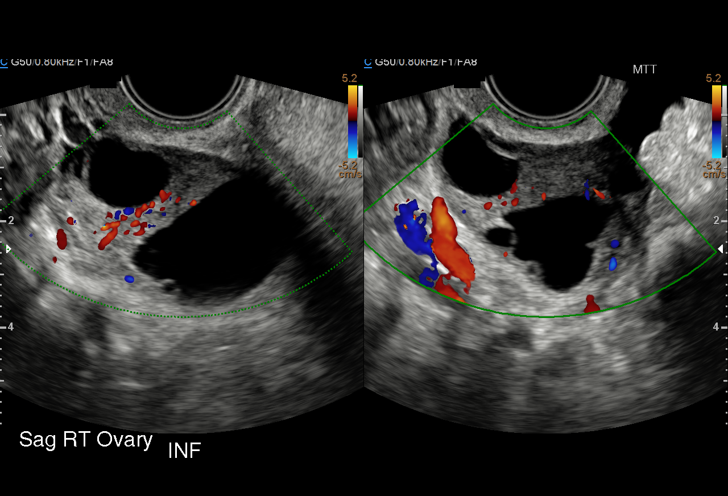
[im 67/73]
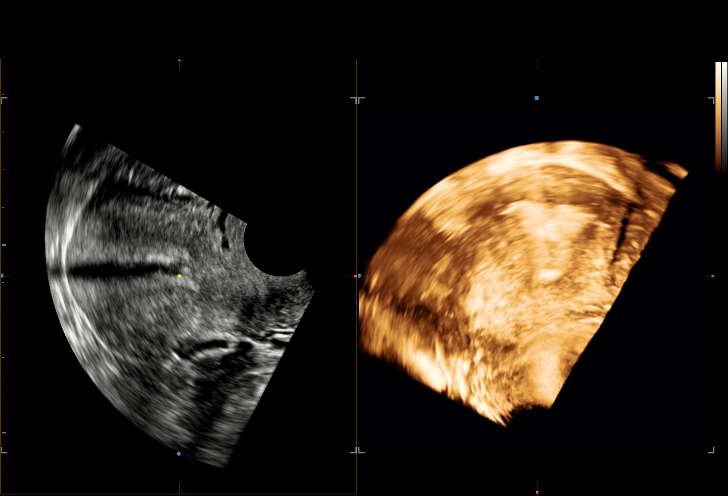
[im 73/73]
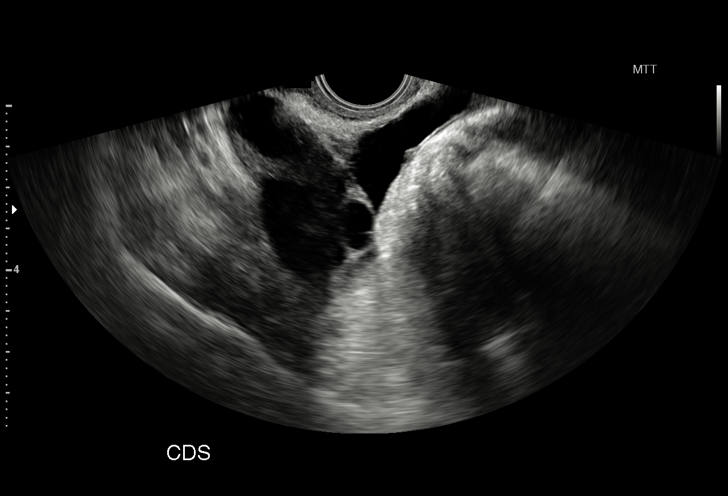

[15 of 25 positions shown; findings below may reference images not displayed]

FINDINGS: Uterus

Measurements: 7.8 x 4.4 x 4.6 cm. Retroverted. No fibroids or other
mass visualized.

Endometrium

Thickness: 6 mm.  IUD is visualized within the endometrial cavity.

Right ovary

Measurements: 4.2 x 3.1 x 4.5 cm. Several follicles are noted, with
largest measuring 3.0 cm. No masses identified.

Left ovary

Measurements: 2.1 x 2.4 x 3.3 cm. Normal appearance/no adnexal mass.

Other findings

Tiny amount of free fluid in pelvic cul-de-sac is likely
physiologic.
IMPRESSION: Retroverted uterus, with IUD visualized within endometrial cavity.
No fibroids identified.

Normal appearance of both ovaries.  No adnexal mass identified.
